# Patient Record
Sex: Male | Born: 1950
Health system: Southern US, Community
[De-identification: ages and names within clinical notes are randomized; demographics above are authoritative.]

## PROBLEM LIST (undated history)

## (undated) HISTORY — PX: ROTATOR CUFF REPAIR: SHX139

---

## 2001-03-11 ENCOUNTER — Ambulatory Visit (HOSPITAL_COMMUNITY): Admission: RE | Admit: 2001-03-11 | Discharge: 2001-03-11 | Payer: Self-pay | Admitting: Family Medicine

## 2001-03-11 ENCOUNTER — Encounter: Payer: Self-pay | Admitting: Family Medicine

## 2001-04-04 ENCOUNTER — Ambulatory Visit (HOSPITAL_COMMUNITY): Admission: RE | Admit: 2001-04-04 | Discharge: 2001-04-04 | Payer: Self-pay | Admitting: Family Medicine

## 2001-04-04 ENCOUNTER — Encounter: Payer: Self-pay | Admitting: Family Medicine

## 2004-07-07 ENCOUNTER — Ambulatory Visit (HOSPITAL_BASED_OUTPATIENT_CLINIC_OR_DEPARTMENT_OTHER): Admission: RE | Admit: 2004-07-07 | Discharge: 2004-07-07 | Payer: Self-pay | Admitting: Orthopedic Surgery

## 2004-07-07 ENCOUNTER — Ambulatory Visit (HOSPITAL_COMMUNITY): Admission: RE | Admit: 2004-07-07 | Discharge: 2004-07-07 | Payer: Self-pay | Admitting: Orthopedic Surgery

## 2010-11-14 NOTE — Op Note (Signed)
Jose Miles, Jose Miles NO.:  [id number]   MEDICAL RECORD NO.:  [id number]          PATIENT TYPE:  AMB   LOCATION:  DSC                          FACILITY:  [facility]   PHYSICIAN:  Robert A. Thurston Hole, M.D. DATE OF BIRTH:  October 06, 1950   DATE OF PROCEDURE:  07/07/2004  DATE OF DISCHARGE:                                 OPERATIVE REPORT   PREOPERATIVE DIAGNOSES:  1.  Right shoulder rotator cuff tear.  2.  Right shoulder partial labrum tear.  3.  Right shoulder impingement.   POSTOPERATIVE DIAGNOSES:  1.  Right shoulder rotator cuff tear.  2.  Right shoulder partial labrum tear.  3.  Right shoulder impingement.   PROCEDURES:  1.  Right shoulder examination under anesthesia followed by arthroscopically      assisted  rotator cuff repair using Arthrex suture anchor x1.  2.  Right shoulder arthroscopic partial labrum tear debridement.  3.  Right shoulder subacromial decompression.   SURGEON:  Elana Alm. Thurston Hole, M.D.   ASSISTANT:  Julien Girt, P.A.   ANESTHESIA:  General anesthesia.   OPERATIVE TIME:  One hour.   COMPLICATIONS:  None.   INDICATIONS FOR PROCEDURE:  Jose Miles is a 60 year old gentleman who has had  one to two years of increasing right shoulder pain with exam and MRI  documenting a partial versus complete rotator cuff tear with impingement and  partial labrum tear who has failed conservative care and is now to undergo  arthroscopy.   DESCRIPTION OF PROCEDURE:  Jose Miles is brought to the operating room on  July 07, 2004, after an interscalene block had been placed in the holding  room by anesthesia.  He is placed on the operating table in the supine  position.  His right shoulder was examined under anesthesia.  He had a full  range of motion in his shoulder with stable ligamentous examination.  He  received Ancef 1 g intraoperatively for prophylaxis.  He was then placed in  beach chair position and shoulder and arm were prepped using sterile  DuraPrep and draped using sterile technique.  Originally through a posterior  arthroscopic portal, the arthroscope with a pump attachment was placed and  through an anterior portal, arthroscopic probe was placed.  On initial  inspection, the articular cartilage in the glenohumeral joint was intact.  Anterior labrum partial tearing and superiorly 25 to 30% which was debrided.  Inferior labrum and anterior inferior glenohumeral ligament complex was  intact.  Biceps  tendon anchor and biceps tendon was intact. Posterior  labrum was intact.  Rotator cuff on the articular surface had a high grade  partial tear, 30 to 40% in the supraspinatus as well as the infraspinatus  which was debrided arthroscopically but he did not have a complete tear.  The rest of the rotator cuff was intact.  The inferior capsular recess was  free of pathology.  Subacromial space was entered and a lateral arthroscopic  portal was made.  Moderately thickened bursitis was resected.  Impingement  was noted and a subacromial decompression was carried out removing 6 to 8 mm  of the undersurface of the anterior, anterolateral  and anterior medial  acromion and CA ligament release was carried out as well.  The [facility] joint  showed no impingement into the subacromial space, thus it was not resected.  The rotator cuff on the bursal side showed a significant tear on the  supraspinatus although the deep fibers were still intact.  The superficial  fibers were torn from the greater tuberosity attachment and this was  amenable to repair.  The deep fibers were kept intact and an Arthrex suture  anchor was placed in the greater tuberosity and then each of the sutures in  this anchor was placed through the rotator cuff tear and tied down, thus  securing the rotator cuff tear back down to the greater tuberosity.  There  was a separate posterior partial tear of the infraspinatus and teres minor  that was layered.  This was partially debrided  arthroscopically but it did  not communicate with the joint, thus after debridement no further repair was  necessary of this posterior piece.  After this was done, the shoulder could  be brought through a full range of motion with no impingement on the repair.  At this point, it was felt that all the pathology had been satisfactory  addressed.  The instruments were removed.  Portals closed with 3-0 nylon  suture.  Sterile dressings and a sling applied and the patient awakened and  taken to the recovery room in stable condition.   FOLLOW UP:  Jose Miles will be followed as an outpatient on Percocet for pain  with early physical therapy.  See him back in the office in a week for  sutures out and follow-up.       RAW/MEDQ  D:  07/07/2004  T:  07/07/2004  Job:  [id number]

## 2011-06-09 ENCOUNTER — Ambulatory Visit: Payer: 59 | Attending: Sports Medicine | Admitting: Physical Therapy

## 2011-06-09 DIAGNOSIS — IMO0001 Reserved for inherently not codable concepts without codable children: Secondary | ICD-10-CM | POA: Insufficient documentation

## 2011-06-09 DIAGNOSIS — M2569 Stiffness of other specified joint, not elsewhere classified: Secondary | ICD-10-CM | POA: Insufficient documentation

## 2011-06-09 DIAGNOSIS — M25519 Pain in unspecified shoulder: Secondary | ICD-10-CM | POA: Insufficient documentation

## 2011-06-10 ENCOUNTER — Ambulatory Visit: Payer: 59 | Admitting: Physical Therapy

## 2011-06-17 ENCOUNTER — Ambulatory Visit: Payer: 59 | Admitting: Physical Therapy

## 2011-06-18 ENCOUNTER — Ambulatory Visit: Payer: 59 | Admitting: Physical Therapy

## 2011-06-25 ENCOUNTER — Ambulatory Visit: Payer: Self-pay | Admitting: Physical Therapy

## 2011-06-25 ENCOUNTER — Ambulatory Visit: Payer: 59 | Admitting: Physical Therapy

## 2011-07-01 ENCOUNTER — Ambulatory Visit: Payer: 59 | Attending: Sports Medicine | Admitting: Physical Therapy

## 2011-07-01 DIAGNOSIS — M25519 Pain in unspecified shoulder: Secondary | ICD-10-CM | POA: Insufficient documentation

## 2011-07-01 DIAGNOSIS — IMO0001 Reserved for inherently not codable concepts without codable children: Secondary | ICD-10-CM | POA: Insufficient documentation

## 2011-07-01 DIAGNOSIS — M2569 Stiffness of other specified joint, not elsewhere classified: Secondary | ICD-10-CM | POA: Insufficient documentation

## 2011-07-03 ENCOUNTER — Encounter: Payer: Self-pay | Admitting: Physical Therapy

## 2011-07-07 ENCOUNTER — Ambulatory Visit: Payer: 59 | Admitting: Physical Therapy

## 2011-07-09 ENCOUNTER — Encounter: Payer: Self-pay | Admitting: Physical Therapy

## 2011-09-15 ENCOUNTER — Other Ambulatory Visit (HOSPITAL_COMMUNITY): Payer: Self-pay | Admitting: Chiropractic Medicine

## 2011-09-15 ENCOUNTER — Ambulatory Visit (HOSPITAL_COMMUNITY)
Admission: RE | Admit: 2011-09-15 | Discharge: 2011-09-15 | Disposition: A | Discharge status: Home / Self Care | Payer: 59 | Source: Ambulatory Visit | Attending: Chiropractic Medicine | Admitting: Chiropractic Medicine

## 2011-09-15 DIAGNOSIS — I77810 Thoracic aortic ectasia: Secondary | ICD-10-CM | POA: Insufficient documentation

## 2011-09-15 DIAGNOSIS — M25519 Pain in unspecified shoulder: Secondary | ICD-10-CM | POA: Insufficient documentation

## 2011-09-15 DIAGNOSIS — R52 Pain, unspecified: Secondary | ICD-10-CM

## 2012-12-30 IMAGING — CR DG RIBS W/ CHEST 3+V*L*
5 series · 5 of 5 positions shown · non-contrast
Comparison: None.

CLINICAL DATA: 6-month history of pain in the posterior aspect of
the left shoulder around the scapula..  No known injury or fall.

LEFT RIBS AND CHEST - 3+ VIEW

[w chest pa]
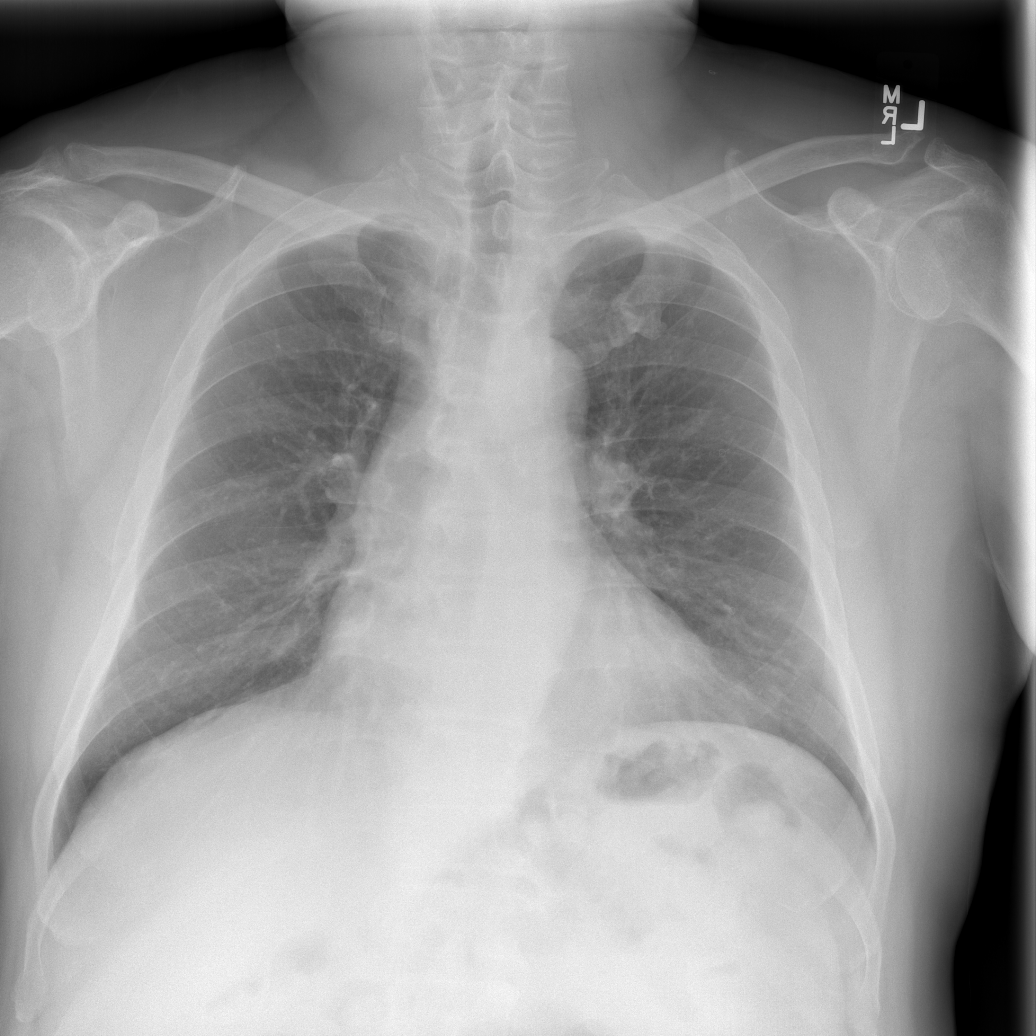

[w ribs ap/pa upper left *]
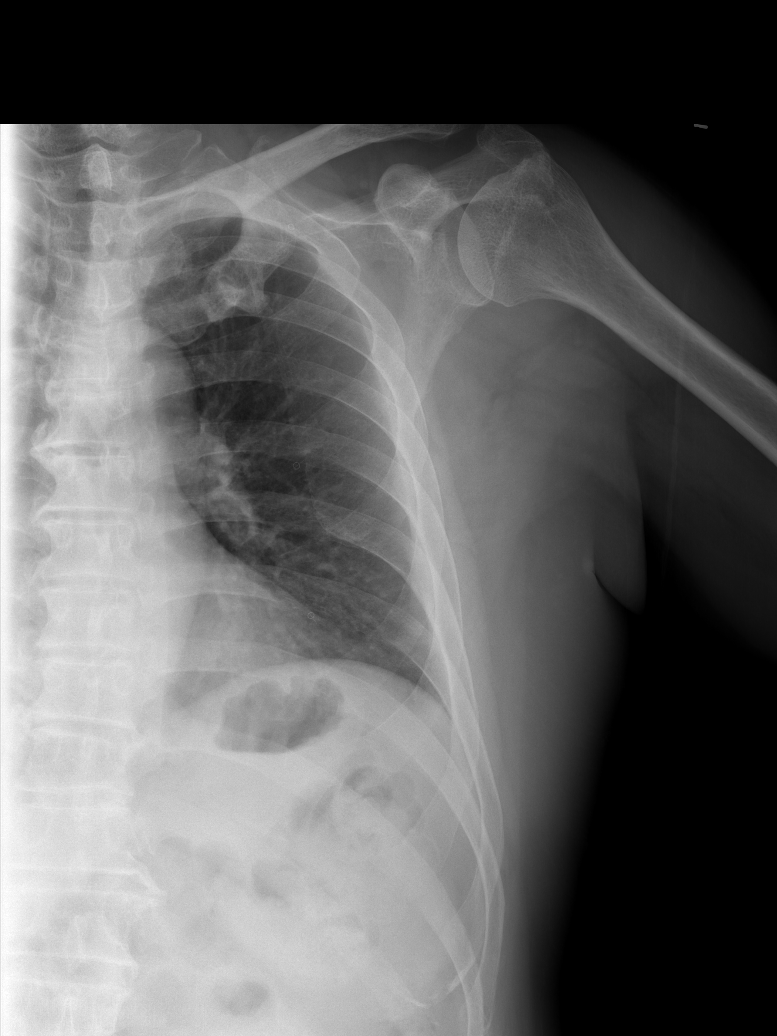

[w ribs ap/pa lower left *]
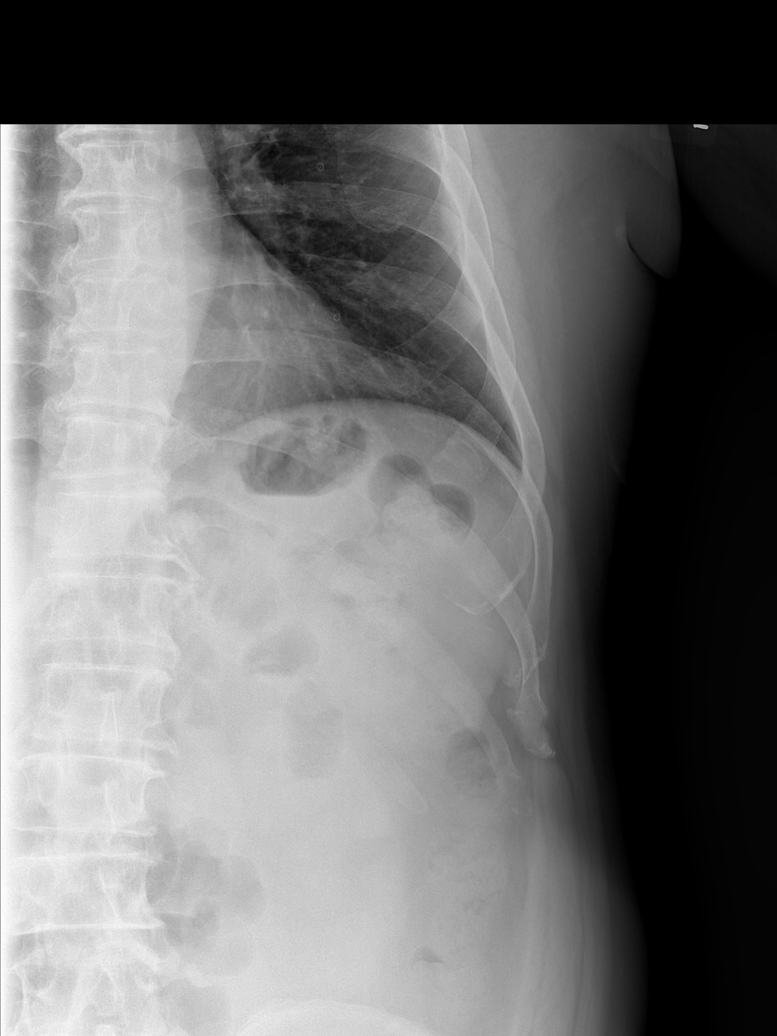

[w ribs oblique left * (1 of 2)]
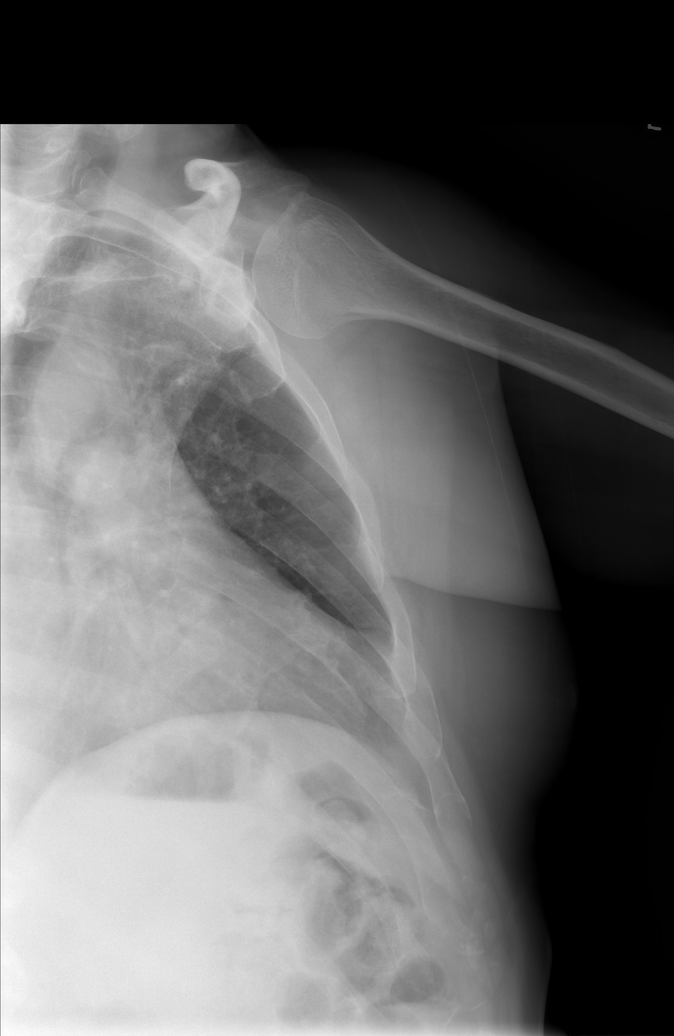

[w ribs oblique left * (2 of 2)]
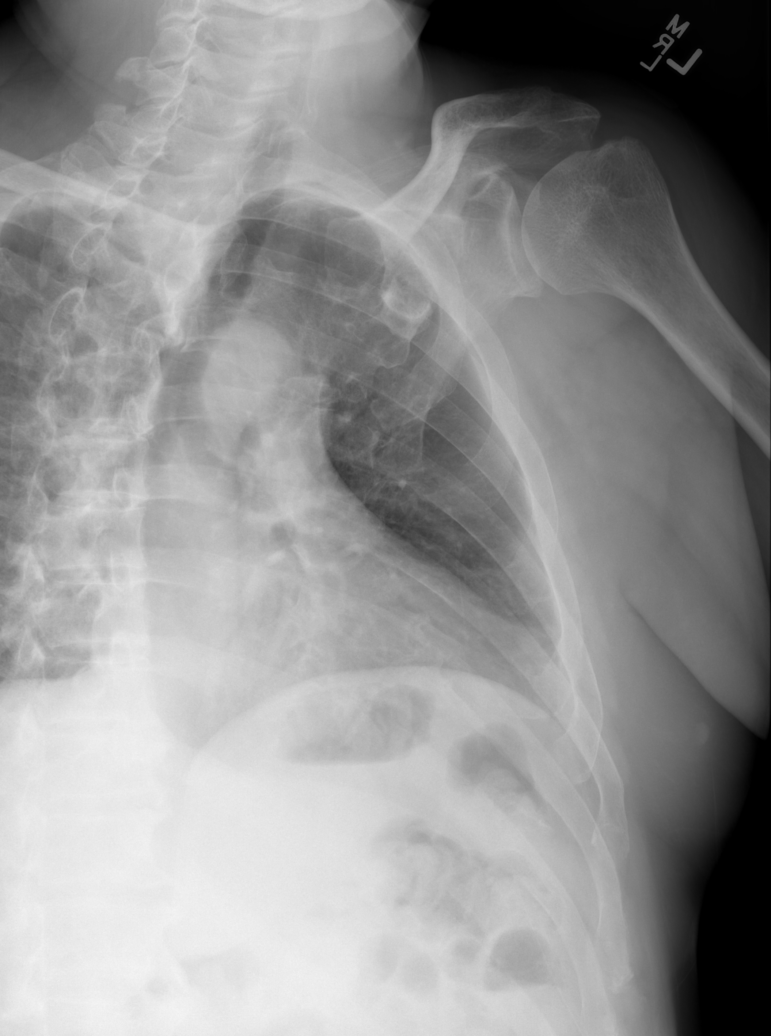

[5 of 5 positions shown; findings below may reference images not displayed]

FINDINGS: Cardiac silhouette is borderline in size.  Ectasia and
tortuosity of thoracic aorta are seen.  No pulmonary edema,
pneumonia, or pleural effusion is evident.  Apical pleural or
extrapleural fat densities are symmetrical.  No rib lesion is
evident.
IMPRESSION: Borderline heart size.  No acute cardiopulmonary or pleural
abnormalities are seen.  No pneumothorax or rib lesion is evident.

## 2015-11-08 ENCOUNTER — Ambulatory Visit (INDEPENDENT_AMBULATORY_CARE_PROVIDER_SITE_OTHER): Payer: No Typology Code available for payment source

## 2015-11-08 ENCOUNTER — Ambulatory Visit (INDEPENDENT_AMBULATORY_CARE_PROVIDER_SITE_OTHER): Payer: No Typology Code available for payment source | Admitting: Podiatry

## 2015-11-08 ENCOUNTER — Encounter: Payer: Self-pay | Admitting: Podiatry

## 2015-11-08 VITALS — BP 125/72 | HR 80 | Resp 16 | Ht 65.0 in | Wt 214.0 lb

## 2015-11-08 DIAGNOSIS — M779 Enthesopathy, unspecified: Secondary | ICD-10-CM

## 2015-11-08 DIAGNOSIS — M79671 Pain in right foot: Secondary | ICD-10-CM | POA: Diagnosis not present

## 2015-11-08 DIAGNOSIS — L84 Corns and callosities: Secondary | ICD-10-CM

## 2015-11-08 DIAGNOSIS — M2041 Other hammer toe(s) (acquired), right foot: Secondary | ICD-10-CM

## 2015-11-08 NOTE — Progress Notes (Signed)
   Subjective:    Patient ID: Jose FlatteryYoshio Monds, male    DOB: 08/29/1950, 65 y.o.   MRN: [id number]  HPI Chief Complaint  Patient presents with  . Foot Orthotics    Wants to get new ones made; pt diabetic type 2; sugar=did not take today; A1C=7.4  . Foot Pain    Right foot; 5th toe; pt stated, "Toe is pushing into shoe"; x1 year (corn?)      Review of Systems  All other systems reviewed and are negative.      Objective:   Physical Exam        Assessment & Plan:

## 2015-11-08 NOTE — Progress Notes (Signed)
Subjective:     Patient ID: Jose Miles, male   DOB: 09/29/1950, 65 y.o.   MRN: [id number]  HPI patient states I developed a lot of pain in the side of my right fifth toe that's been going on for a while. States that also pain in the feet is generalized and orthotics and been helpful but they have worn out and the toe seems to become irritated every year or 2   Review of Systems  All other systems reviewed and are negative.      Objective:   Physical Exam  Constitutional: He is oriented to person, place, and time.  Cardiovascular: Intact distal pulses.   Musculoskeletal: Normal range of motion.  Neurological: He is oriented to person, place, and time.  Skin: Skin is warm.  Nursing note and vitals reviewed.  neurovascular status found to be intact with muscle strength adequate range of motion within normal limits. Patient's found to have inflammation and distal lateral keratotic lesion digit 5 right that's painful when pressed with rotation and moderate discomfort in the plantar feet bilateral with good digital perfusion and patient found to be well oriented 3     Assessment:     Hammertoe deformity fifth digit right with rotation and distal keratotic tissue formation with tendinitis    Plan:     H&P and x-ray reviewed with patient. Today I debrided the lesion on the right fifth toe and applied padding and discussed possible arthroplasty which may be necessary and I scanned for orthotics to reduce pressure against the plantar foot and feet bilateral  X-ray report indicates significant rotation digit 5 right at the distal interphalangeal joint

## 2015-12-04 ENCOUNTER — Ambulatory Visit: Payer: Medicare Other | Admitting: *Deleted

## 2015-12-04 DIAGNOSIS — M79671 Pain in right foot: Secondary | ICD-10-CM

## 2015-12-04 NOTE — Progress Notes (Signed)
Patient ID: Jose FlatteryYoshio Trager, male   DOB: 08/27/1950, 65 y.o.   MRN: [id number] Patient presents for orthotic pick up.  Orthotics were found to be missing 3/4" lift on the right that the patient did not mention he needed.  Orthotics will be sent back for the additional accomodation and we will call patient when they return.

## 2015-12-04 NOTE — Patient Instructions (Signed)

## 2016-07-28 DIAGNOSIS — D696 Thrombocytopenia, unspecified: Secondary | ICD-10-CM | POA: Diagnosis not present

## 2016-07-28 DIAGNOSIS — E113513 Type 2 diabetes mellitus with proliferative diabetic retinopathy with macular edema, bilateral: Secondary | ICD-10-CM | POA: Diagnosis not present

## 2016-07-28 DIAGNOSIS — E782 Mixed hyperlipidemia: Secondary | ICD-10-CM | POA: Diagnosis not present

## 2016-07-29 DIAGNOSIS — I10 Essential (primary) hypertension: Secondary | ICD-10-CM | POA: Diagnosis not present

## 2016-07-29 DIAGNOSIS — E782 Mixed hyperlipidemia: Secondary | ICD-10-CM | POA: Diagnosis not present

## 2016-07-29 DIAGNOSIS — E559 Vitamin D deficiency, unspecified: Secondary | ICD-10-CM | POA: Diagnosis not present

## 2016-07-29 DIAGNOSIS — E113213 Type 2 diabetes mellitus with mild nonproliferative diabetic retinopathy with macular edema, bilateral: Secondary | ICD-10-CM | POA: Diagnosis not present

## 2016-07-29 DIAGNOSIS — D691 Qualitative platelet defects: Secondary | ICD-10-CM | POA: Diagnosis not present

## 2016-07-29 DIAGNOSIS — E1165 Type 2 diabetes mellitus with hyperglycemia: Secondary | ICD-10-CM | POA: Diagnosis not present

## 2016-07-29 DIAGNOSIS — Z794 Long term (current) use of insulin: Secondary | ICD-10-CM | POA: Diagnosis not present

## 2016-10-21 DIAGNOSIS — E782 Mixed hyperlipidemia: Secondary | ICD-10-CM | POA: Diagnosis not present

## 2016-10-21 DIAGNOSIS — E1165 Type 2 diabetes mellitus with hyperglycemia: Secondary | ICD-10-CM | POA: Diagnosis not present

## 2016-10-21 DIAGNOSIS — Z125 Encounter for screening for malignant neoplasm of prostate: Secondary | ICD-10-CM | POA: Diagnosis not present

## 2016-10-21 DIAGNOSIS — D691 Qualitative platelet defects: Secondary | ICD-10-CM | POA: Diagnosis not present

## 2016-10-21 DIAGNOSIS — Z794 Long term (current) use of insulin: Secondary | ICD-10-CM | POA: Diagnosis not present

## 2016-10-26 DIAGNOSIS — I10 Essential (primary) hypertension: Secondary | ICD-10-CM | POA: Diagnosis not present

## 2016-10-26 DIAGNOSIS — Z794 Long term (current) use of insulin: Secondary | ICD-10-CM | POA: Diagnosis not present

## 2016-10-26 DIAGNOSIS — E113213 Type 2 diabetes mellitus with mild nonproliferative diabetic retinopathy with macular edema, bilateral: Secondary | ICD-10-CM | POA: Diagnosis not present

## 2016-10-26 DIAGNOSIS — E559 Vitamin D deficiency, unspecified: Secondary | ICD-10-CM | POA: Diagnosis not present

## 2016-10-26 DIAGNOSIS — E782 Mixed hyperlipidemia: Secondary | ICD-10-CM | POA: Diagnosis not present

## 2016-10-26 DIAGNOSIS — Z125 Encounter for screening for malignant neoplasm of prostate: Secondary | ICD-10-CM | POA: Diagnosis not present

## 2016-10-26 DIAGNOSIS — D696 Thrombocytopenia, unspecified: Secondary | ICD-10-CM | POA: Diagnosis not present

## 2016-11-19 DIAGNOSIS — H2513 Age-related nuclear cataract, bilateral: Secondary | ICD-10-CM | POA: Diagnosis not present

## 2016-11-19 DIAGNOSIS — H43392 Other vitreous opacities, left eye: Secondary | ICD-10-CM | POA: Diagnosis not present

## 2016-11-19 DIAGNOSIS — E113293 Type 2 diabetes mellitus with mild nonproliferative diabetic retinopathy without macular edema, bilateral: Secondary | ICD-10-CM | POA: Diagnosis not present

## 2016-11-26 DIAGNOSIS — M7582 Other shoulder lesions, left shoulder: Secondary | ICD-10-CM | POA: Diagnosis not present

## 2016-11-26 DIAGNOSIS — M722 Plantar fascial fibromatosis: Secondary | ICD-10-CM | POA: Diagnosis not present

## 2017-01-26 DIAGNOSIS — D696 Thrombocytopenia, unspecified: Secondary | ICD-10-CM | POA: Diagnosis not present

## 2017-01-26 DIAGNOSIS — E1165 Type 2 diabetes mellitus with hyperglycemia: Secondary | ICD-10-CM | POA: Diagnosis not present

## 2017-01-26 DIAGNOSIS — E782 Mixed hyperlipidemia: Secondary | ICD-10-CM | POA: Diagnosis not present

## 2017-01-26 DIAGNOSIS — Z794 Long term (current) use of insulin: Secondary | ICD-10-CM | POA: Diagnosis not present

## 2017-02-08 DIAGNOSIS — E1165 Type 2 diabetes mellitus with hyperglycemia: Secondary | ICD-10-CM | POA: Diagnosis not present

## 2017-02-08 DIAGNOSIS — I10 Essential (primary) hypertension: Secondary | ICD-10-CM | POA: Diagnosis not present

## 2017-02-08 DIAGNOSIS — E113213 Type 2 diabetes mellitus with mild nonproliferative diabetic retinopathy with macular edema, bilateral: Secondary | ICD-10-CM | POA: Diagnosis not present

## 2017-02-08 DIAGNOSIS — E782 Mixed hyperlipidemia: Secondary | ICD-10-CM | POA: Diagnosis not present

## 2017-05-11 DIAGNOSIS — D696 Thrombocytopenia, unspecified: Secondary | ICD-10-CM | POA: Diagnosis not present

## 2017-05-11 DIAGNOSIS — Z794 Long term (current) use of insulin: Secondary | ICD-10-CM | POA: Diagnosis not present

## 2017-05-11 DIAGNOSIS — E1165 Type 2 diabetes mellitus with hyperglycemia: Secondary | ICD-10-CM | POA: Diagnosis not present

## 2017-05-11 DIAGNOSIS — E782 Mixed hyperlipidemia: Secondary | ICD-10-CM | POA: Diagnosis not present

## 2017-05-13 DIAGNOSIS — E1165 Type 2 diabetes mellitus with hyperglycemia: Secondary | ICD-10-CM | POA: Diagnosis not present

## 2017-05-13 DIAGNOSIS — E782 Mixed hyperlipidemia: Secondary | ICD-10-CM | POA: Diagnosis not present

## 2017-05-13 DIAGNOSIS — I10 Essential (primary) hypertension: Secondary | ICD-10-CM | POA: Diagnosis not present

## 2017-05-13 DIAGNOSIS — Z23 Encounter for immunization: Secondary | ICD-10-CM | POA: Diagnosis not present

## 2017-05-13 DIAGNOSIS — E113213 Type 2 diabetes mellitus with mild nonproliferative diabetic retinopathy with macular edema, bilateral: Secondary | ICD-10-CM | POA: Diagnosis not present

## 2017-08-02 DIAGNOSIS — Z794 Long term (current) use of insulin: Secondary | ICD-10-CM | POA: Diagnosis not present

## 2017-08-02 DIAGNOSIS — D696 Thrombocytopenia, unspecified: Secondary | ICD-10-CM | POA: Diagnosis not present

## 2017-08-02 DIAGNOSIS — E782 Mixed hyperlipidemia: Secondary | ICD-10-CM | POA: Diagnosis not present

## 2017-08-02 DIAGNOSIS — E1165 Type 2 diabetes mellitus with hyperglycemia: Secondary | ICD-10-CM | POA: Diagnosis not present

## 2017-08-09 DIAGNOSIS — I10 Essential (primary) hypertension: Secondary | ICD-10-CM | POA: Diagnosis not present

## 2017-08-09 DIAGNOSIS — E113213 Type 2 diabetes mellitus with mild nonproliferative diabetic retinopathy with macular edema, bilateral: Secondary | ICD-10-CM | POA: Diagnosis not present

## 2017-08-09 DIAGNOSIS — E782 Mixed hyperlipidemia: Secondary | ICD-10-CM | POA: Diagnosis not present

## 2017-08-09 DIAGNOSIS — E1165 Type 2 diabetes mellitus with hyperglycemia: Secondary | ICD-10-CM | POA: Diagnosis not present

## 2017-11-09 DIAGNOSIS — E782 Mixed hyperlipidemia: Secondary | ICD-10-CM | POA: Diagnosis not present

## 2017-11-09 DIAGNOSIS — Z125 Encounter for screening for malignant neoplasm of prostate: Secondary | ICD-10-CM | POA: Diagnosis not present

## 2017-11-09 DIAGNOSIS — E559 Vitamin D deficiency, unspecified: Secondary | ICD-10-CM | POA: Diagnosis not present

## 2017-11-09 DIAGNOSIS — Z794 Long term (current) use of insulin: Secondary | ICD-10-CM | POA: Diagnosis not present

## 2017-11-09 DIAGNOSIS — E1165 Type 2 diabetes mellitus with hyperglycemia: Secondary | ICD-10-CM | POA: Diagnosis not present

## 2017-11-11 DIAGNOSIS — E782 Mixed hyperlipidemia: Secondary | ICD-10-CM | POA: Diagnosis not present

## 2017-11-11 DIAGNOSIS — E113213 Type 2 diabetes mellitus with mild nonproliferative diabetic retinopathy with macular edema, bilateral: Secondary | ICD-10-CM | POA: Diagnosis not present

## 2017-11-11 DIAGNOSIS — E1165 Type 2 diabetes mellitus with hyperglycemia: Secondary | ICD-10-CM | POA: Diagnosis not present

## 2017-11-11 DIAGNOSIS — I10 Essential (primary) hypertension: Secondary | ICD-10-CM | POA: Diagnosis not present

## 2017-12-02 DIAGNOSIS — M5116 Intervertebral disc disorders with radiculopathy, lumbar region: Secondary | ICD-10-CM | POA: Diagnosis not present

## 2017-12-02 DIAGNOSIS — M9903 Segmental and somatic dysfunction of lumbar region: Secondary | ICD-10-CM | POA: Diagnosis not present

## 2017-12-02 DIAGNOSIS — Q72812 Congenital shortening of left lower limb: Secondary | ICD-10-CM | POA: Diagnosis not present

## 2017-12-02 DIAGNOSIS — M9905 Segmental and somatic dysfunction of pelvic region: Secondary | ICD-10-CM | POA: Diagnosis not present

## 2017-12-07 DIAGNOSIS — M5116 Intervertebral disc disorders with radiculopathy, lumbar region: Secondary | ICD-10-CM | POA: Diagnosis not present

## 2017-12-07 DIAGNOSIS — Q72812 Congenital shortening of left lower limb: Secondary | ICD-10-CM | POA: Diagnosis not present

## 2017-12-07 DIAGNOSIS — M9903 Segmental and somatic dysfunction of lumbar region: Secondary | ICD-10-CM | POA: Diagnosis not present

## 2017-12-07 DIAGNOSIS — M9905 Segmental and somatic dysfunction of pelvic region: Secondary | ICD-10-CM | POA: Diagnosis not present

## 2017-12-08 DIAGNOSIS — M9903 Segmental and somatic dysfunction of lumbar region: Secondary | ICD-10-CM | POA: Diagnosis not present

## 2017-12-08 DIAGNOSIS — Q72812 Congenital shortening of left lower limb: Secondary | ICD-10-CM | POA: Diagnosis not present

## 2017-12-08 DIAGNOSIS — M9905 Segmental and somatic dysfunction of pelvic region: Secondary | ICD-10-CM | POA: Diagnosis not present

## 2017-12-08 DIAGNOSIS — M5116 Intervertebral disc disorders with radiculopathy, lumbar region: Secondary | ICD-10-CM | POA: Diagnosis not present

## 2017-12-09 DIAGNOSIS — Q72812 Congenital shortening of left lower limb: Secondary | ICD-10-CM | POA: Diagnosis not present

## 2017-12-09 DIAGNOSIS — M9903 Segmental and somatic dysfunction of lumbar region: Secondary | ICD-10-CM | POA: Diagnosis not present

## 2017-12-09 DIAGNOSIS — M9905 Segmental and somatic dysfunction of pelvic region: Secondary | ICD-10-CM | POA: Diagnosis not present

## 2017-12-09 DIAGNOSIS — M5116 Intervertebral disc disorders with radiculopathy, lumbar region: Secondary | ICD-10-CM | POA: Diagnosis not present

## 2017-12-16 DIAGNOSIS — M9905 Segmental and somatic dysfunction of pelvic region: Secondary | ICD-10-CM | POA: Diagnosis not present

## 2017-12-16 DIAGNOSIS — M5116 Intervertebral disc disorders with radiculopathy, lumbar region: Secondary | ICD-10-CM | POA: Diagnosis not present

## 2017-12-16 DIAGNOSIS — M9903 Segmental and somatic dysfunction of lumbar region: Secondary | ICD-10-CM | POA: Diagnosis not present

## 2017-12-16 DIAGNOSIS — Q72812 Congenital shortening of left lower limb: Secondary | ICD-10-CM | POA: Diagnosis not present

## 2017-12-20 DIAGNOSIS — Q72812 Congenital shortening of left lower limb: Secondary | ICD-10-CM | POA: Diagnosis not present

## 2017-12-20 DIAGNOSIS — M5116 Intervertebral disc disorders with radiculopathy, lumbar region: Secondary | ICD-10-CM | POA: Diagnosis not present

## 2017-12-20 DIAGNOSIS — M9903 Segmental and somatic dysfunction of lumbar region: Secondary | ICD-10-CM | POA: Diagnosis not present

## 2017-12-20 DIAGNOSIS — M9905 Segmental and somatic dysfunction of pelvic region: Secondary | ICD-10-CM | POA: Diagnosis not present

## 2017-12-21 DIAGNOSIS — M5116 Intervertebral disc disorders with radiculopathy, lumbar region: Secondary | ICD-10-CM | POA: Diagnosis not present

## 2017-12-21 DIAGNOSIS — Q72812 Congenital shortening of left lower limb: Secondary | ICD-10-CM | POA: Diagnosis not present

## 2017-12-21 DIAGNOSIS — M9905 Segmental and somatic dysfunction of pelvic region: Secondary | ICD-10-CM | POA: Diagnosis not present

## 2017-12-21 DIAGNOSIS — M9903 Segmental and somatic dysfunction of lumbar region: Secondary | ICD-10-CM | POA: Diagnosis not present

## 2017-12-23 DIAGNOSIS — Q72812 Congenital shortening of left lower limb: Secondary | ICD-10-CM | POA: Diagnosis not present

## 2017-12-23 DIAGNOSIS — M9903 Segmental and somatic dysfunction of lumbar region: Secondary | ICD-10-CM | POA: Diagnosis not present

## 2017-12-23 DIAGNOSIS — M5116 Intervertebral disc disorders with radiculopathy, lumbar region: Secondary | ICD-10-CM | POA: Diagnosis not present

## 2017-12-23 DIAGNOSIS — M9905 Segmental and somatic dysfunction of pelvic region: Secondary | ICD-10-CM | POA: Diagnosis not present

## 2018-01-06 DIAGNOSIS — M5116 Intervertebral disc disorders with radiculopathy, lumbar region: Secondary | ICD-10-CM | POA: Diagnosis not present

## 2018-01-06 DIAGNOSIS — Q72812 Congenital shortening of left lower limb: Secondary | ICD-10-CM | POA: Diagnosis not present

## 2018-01-06 DIAGNOSIS — M9903 Segmental and somatic dysfunction of lumbar region: Secondary | ICD-10-CM | POA: Diagnosis not present

## 2018-01-06 DIAGNOSIS — M9905 Segmental and somatic dysfunction of pelvic region: Secondary | ICD-10-CM | POA: Diagnosis not present

## 2018-01-10 DIAGNOSIS — M9903 Segmental and somatic dysfunction of lumbar region: Secondary | ICD-10-CM | POA: Diagnosis not present

## 2018-01-10 DIAGNOSIS — M9905 Segmental and somatic dysfunction of pelvic region: Secondary | ICD-10-CM | POA: Diagnosis not present

## 2018-01-10 DIAGNOSIS — M5116 Intervertebral disc disorders with radiculopathy, lumbar region: Secondary | ICD-10-CM | POA: Diagnosis not present

## 2018-01-10 DIAGNOSIS — Q72812 Congenital shortening of left lower limb: Secondary | ICD-10-CM | POA: Diagnosis not present

## 2018-01-13 DIAGNOSIS — M9905 Segmental and somatic dysfunction of pelvic region: Secondary | ICD-10-CM | POA: Diagnosis not present

## 2018-01-13 DIAGNOSIS — M9903 Segmental and somatic dysfunction of lumbar region: Secondary | ICD-10-CM | POA: Diagnosis not present

## 2018-01-13 DIAGNOSIS — M5116 Intervertebral disc disorders with radiculopathy, lumbar region: Secondary | ICD-10-CM | POA: Diagnosis not present

## 2018-01-13 DIAGNOSIS — Q72812 Congenital shortening of left lower limb: Secondary | ICD-10-CM | POA: Diagnosis not present

## 2018-01-17 DIAGNOSIS — M9905 Segmental and somatic dysfunction of pelvic region: Secondary | ICD-10-CM | POA: Diagnosis not present

## 2018-01-17 DIAGNOSIS — M9903 Segmental and somatic dysfunction of lumbar region: Secondary | ICD-10-CM | POA: Diagnosis not present

## 2018-01-17 DIAGNOSIS — M5116 Intervertebral disc disorders with radiculopathy, lumbar region: Secondary | ICD-10-CM | POA: Diagnosis not present

## 2018-01-17 DIAGNOSIS — Q72812 Congenital shortening of left lower limb: Secondary | ICD-10-CM | POA: Diagnosis not present

## 2018-01-20 DIAGNOSIS — M9905 Segmental and somatic dysfunction of pelvic region: Secondary | ICD-10-CM | POA: Diagnosis not present

## 2018-01-20 DIAGNOSIS — M9903 Segmental and somatic dysfunction of lumbar region: Secondary | ICD-10-CM | POA: Diagnosis not present

## 2018-01-20 DIAGNOSIS — M5116 Intervertebral disc disorders with radiculopathy, lumbar region: Secondary | ICD-10-CM | POA: Diagnosis not present

## 2018-01-20 DIAGNOSIS — Q72812 Congenital shortening of left lower limb: Secondary | ICD-10-CM | POA: Diagnosis not present

## 2018-01-25 DIAGNOSIS — M9903 Segmental and somatic dysfunction of lumbar region: Secondary | ICD-10-CM | POA: Diagnosis not present

## 2018-01-25 DIAGNOSIS — M9905 Segmental and somatic dysfunction of pelvic region: Secondary | ICD-10-CM | POA: Diagnosis not present

## 2018-01-25 DIAGNOSIS — M5116 Intervertebral disc disorders with radiculopathy, lumbar region: Secondary | ICD-10-CM | POA: Diagnosis not present

## 2018-01-25 DIAGNOSIS — Q72812 Congenital shortening of left lower limb: Secondary | ICD-10-CM | POA: Diagnosis not present

## 2018-02-01 DIAGNOSIS — M9905 Segmental and somatic dysfunction of pelvic region: Secondary | ICD-10-CM | POA: Diagnosis not present

## 2018-02-01 DIAGNOSIS — M5116 Intervertebral disc disorders with radiculopathy, lumbar region: Secondary | ICD-10-CM | POA: Diagnosis not present

## 2018-02-01 DIAGNOSIS — Q72812 Congenital shortening of left lower limb: Secondary | ICD-10-CM | POA: Diagnosis not present

## 2018-02-01 DIAGNOSIS — M9903 Segmental and somatic dysfunction of lumbar region: Secondary | ICD-10-CM | POA: Diagnosis not present

## 2018-02-24 DIAGNOSIS — H3581 Retinal edema: Secondary | ICD-10-CM | POA: Diagnosis not present

## 2018-02-24 DIAGNOSIS — H2513 Age-related nuclear cataract, bilateral: Secondary | ICD-10-CM | POA: Diagnosis not present

## 2018-02-24 DIAGNOSIS — E113293 Type 2 diabetes mellitus with mild nonproliferative diabetic retinopathy without macular edema, bilateral: Secondary | ICD-10-CM | POA: Diagnosis not present

## 2018-02-24 DIAGNOSIS — H43392 Other vitreous opacities, left eye: Secondary | ICD-10-CM | POA: Diagnosis not present

## 2018-03-15 DIAGNOSIS — D696 Thrombocytopenia, unspecified: Secondary | ICD-10-CM | POA: Diagnosis not present

## 2018-03-15 DIAGNOSIS — E782 Mixed hyperlipidemia: Secondary | ICD-10-CM | POA: Diagnosis not present

## 2018-03-15 DIAGNOSIS — Z794 Long term (current) use of insulin: Secondary | ICD-10-CM | POA: Diagnosis not present

## 2018-03-15 DIAGNOSIS — E1165 Type 2 diabetes mellitus with hyperglycemia: Secondary | ICD-10-CM | POA: Diagnosis not present

## 2018-03-17 DIAGNOSIS — Z794 Long term (current) use of insulin: Secondary | ICD-10-CM | POA: Diagnosis not present

## 2018-03-17 DIAGNOSIS — Z125 Encounter for screening for malignant neoplasm of prostate: Secondary | ICD-10-CM | POA: Diagnosis not present

## 2018-03-17 DIAGNOSIS — E559 Vitamin D deficiency, unspecified: Secondary | ICD-10-CM | POA: Diagnosis not present

## 2018-03-17 DIAGNOSIS — E113213 Type 2 diabetes mellitus with mild nonproliferative diabetic retinopathy with macular edema, bilateral: Secondary | ICD-10-CM | POA: Diagnosis not present

## 2018-03-17 DIAGNOSIS — I10 Essential (primary) hypertension: Secondary | ICD-10-CM | POA: Diagnosis not present

## 2018-03-17 DIAGNOSIS — E782 Mixed hyperlipidemia: Secondary | ICD-10-CM | POA: Diagnosis not present

## 2018-03-17 DIAGNOSIS — D696 Thrombocytopenia, unspecified: Secondary | ICD-10-CM | POA: Diagnosis not present

## 2018-03-17 DIAGNOSIS — E1165 Type 2 diabetes mellitus with hyperglycemia: Secondary | ICD-10-CM | POA: Diagnosis not present

## 2018-03-17 DIAGNOSIS — Z23 Encounter for immunization: Secondary | ICD-10-CM | POA: Diagnosis not present

## 2018-06-13 DIAGNOSIS — E1165 Type 2 diabetes mellitus with hyperglycemia: Secondary | ICD-10-CM | POA: Diagnosis not present

## 2018-06-13 DIAGNOSIS — E782 Mixed hyperlipidemia: Secondary | ICD-10-CM | POA: Diagnosis not present

## 2018-06-13 DIAGNOSIS — Z794 Long term (current) use of insulin: Secondary | ICD-10-CM | POA: Diagnosis not present

## 2018-06-16 DIAGNOSIS — Z794 Long term (current) use of insulin: Secondary | ICD-10-CM | POA: Diagnosis not present

## 2018-06-16 DIAGNOSIS — I10 Essential (primary) hypertension: Secondary | ICD-10-CM | POA: Diagnosis not present

## 2018-06-16 DIAGNOSIS — E1165 Type 2 diabetes mellitus with hyperglycemia: Secondary | ICD-10-CM | POA: Diagnosis not present

## 2018-06-16 DIAGNOSIS — E559 Vitamin D deficiency, unspecified: Secondary | ICD-10-CM | POA: Diagnosis not present

## 2018-06-16 DIAGNOSIS — E782 Mixed hyperlipidemia: Secondary | ICD-10-CM | POA: Diagnosis not present

## 2018-06-16 DIAGNOSIS — E113213 Type 2 diabetes mellitus with mild nonproliferative diabetic retinopathy with macular edema, bilateral: Secondary | ICD-10-CM | POA: Diagnosis not present

## 2018-06-16 DIAGNOSIS — Z125 Encounter for screening for malignant neoplasm of prostate: Secondary | ICD-10-CM | POA: Diagnosis not present

## 2018-06-16 DIAGNOSIS — D696 Thrombocytopenia, unspecified: Secondary | ICD-10-CM | POA: Diagnosis not present

## 2018-09-06 DIAGNOSIS — E1165 Type 2 diabetes mellitus with hyperglycemia: Secondary | ICD-10-CM | POA: Diagnosis not present

## 2018-09-06 DIAGNOSIS — E782 Mixed hyperlipidemia: Secondary | ICD-10-CM | POA: Diagnosis not present

## 2018-09-06 DIAGNOSIS — Z794 Long term (current) use of insulin: Secondary | ICD-10-CM | POA: Diagnosis not present

## 2018-09-13 DIAGNOSIS — D696 Thrombocytopenia, unspecified: Secondary | ICD-10-CM | POA: Diagnosis not present

## 2018-09-13 DIAGNOSIS — E782 Mixed hyperlipidemia: Secondary | ICD-10-CM | POA: Diagnosis not present

## 2018-09-13 DIAGNOSIS — E1165 Type 2 diabetes mellitus with hyperglycemia: Secondary | ICD-10-CM | POA: Diagnosis not present

## 2018-09-13 DIAGNOSIS — Z794 Long term (current) use of insulin: Secondary | ICD-10-CM | POA: Diagnosis not present

## 2018-09-13 DIAGNOSIS — E559 Vitamin D deficiency, unspecified: Secondary | ICD-10-CM | POA: Diagnosis not present

## 2018-09-13 DIAGNOSIS — I10 Essential (primary) hypertension: Secondary | ICD-10-CM | POA: Diagnosis not present

## 2018-09-13 DIAGNOSIS — E113213 Type 2 diabetes mellitus with mild nonproliferative diabetic retinopathy with macular edema, bilateral: Secondary | ICD-10-CM | POA: Diagnosis not present

## 2018-09-13 DIAGNOSIS — Z125 Encounter for screening for malignant neoplasm of prostate: Secondary | ICD-10-CM | POA: Diagnosis not present

## 2018-12-07 DIAGNOSIS — E782 Mixed hyperlipidemia: Secondary | ICD-10-CM | POA: Diagnosis not present

## 2018-12-07 DIAGNOSIS — Z125 Encounter for screening for malignant neoplasm of prostate: Secondary | ICD-10-CM | POA: Diagnosis not present

## 2018-12-07 DIAGNOSIS — Z794 Long term (current) use of insulin: Secondary | ICD-10-CM | POA: Diagnosis not present

## 2018-12-07 DIAGNOSIS — E559 Vitamin D deficiency, unspecified: Secondary | ICD-10-CM | POA: Diagnosis not present

## 2018-12-07 DIAGNOSIS — E1165 Type 2 diabetes mellitus with hyperglycemia: Secondary | ICD-10-CM | POA: Diagnosis not present

## 2018-12-13 DIAGNOSIS — I10 Essential (primary) hypertension: Secondary | ICD-10-CM | POA: Diagnosis not present

## 2018-12-13 DIAGNOSIS — E559 Vitamin D deficiency, unspecified: Secondary | ICD-10-CM | POA: Diagnosis not present

## 2018-12-13 DIAGNOSIS — E1165 Type 2 diabetes mellitus with hyperglycemia: Secondary | ICD-10-CM | POA: Diagnosis not present

## 2018-12-13 DIAGNOSIS — Z125 Encounter for screening for malignant neoplasm of prostate: Secondary | ICD-10-CM | POA: Diagnosis not present

## 2018-12-13 DIAGNOSIS — E782 Mixed hyperlipidemia: Secondary | ICD-10-CM | POA: Diagnosis not present

## 2018-12-13 DIAGNOSIS — Z794 Long term (current) use of insulin: Secondary | ICD-10-CM | POA: Diagnosis not present

## 2018-12-13 DIAGNOSIS — E113213 Type 2 diabetes mellitus with mild nonproliferative diabetic retinopathy with macular edema, bilateral: Secondary | ICD-10-CM | POA: Diagnosis not present

## 2018-12-13 DIAGNOSIS — D696 Thrombocytopenia, unspecified: Secondary | ICD-10-CM | POA: Diagnosis not present

## 2018-12-14 DIAGNOSIS — Z1159 Encounter for screening for other viral diseases: Secondary | ICD-10-CM | POA: Diagnosis not present

## 2019-01-16 DIAGNOSIS — L03012 Cellulitis of left finger: Secondary | ICD-10-CM | POA: Diagnosis not present

## 2019-03-02 DIAGNOSIS — E113293 Type 2 diabetes mellitus with mild nonproliferative diabetic retinopathy without macular edema, bilateral: Secondary | ICD-10-CM | POA: Diagnosis not present

## 2019-03-02 DIAGNOSIS — Z794 Long term (current) use of insulin: Secondary | ICD-10-CM | POA: Diagnosis not present

## 2019-03-02 DIAGNOSIS — H2513 Age-related nuclear cataract, bilateral: Secondary | ICD-10-CM | POA: Diagnosis not present

## 2019-03-02 DIAGNOSIS — H43392 Other vitreous opacities, left eye: Secondary | ICD-10-CM | POA: Diagnosis not present

## 2019-03-09 DIAGNOSIS — Z794 Long term (current) use of insulin: Secondary | ICD-10-CM | POA: Diagnosis not present

## 2019-03-09 DIAGNOSIS — D696 Thrombocytopenia, unspecified: Secondary | ICD-10-CM | POA: Diagnosis not present

## 2019-03-09 DIAGNOSIS — E1165 Type 2 diabetes mellitus with hyperglycemia: Secondary | ICD-10-CM | POA: Diagnosis not present

## 2019-03-09 DIAGNOSIS — E782 Mixed hyperlipidemia: Secondary | ICD-10-CM | POA: Diagnosis not present

## 2019-03-16 DIAGNOSIS — E1165 Type 2 diabetes mellitus with hyperglycemia: Secondary | ICD-10-CM | POA: Diagnosis not present

## 2019-03-16 DIAGNOSIS — Z794 Long term (current) use of insulin: Secondary | ICD-10-CM | POA: Diagnosis not present

## 2019-03-16 DIAGNOSIS — Z23 Encounter for immunization: Secondary | ICD-10-CM | POA: Diagnosis not present

## 2019-03-16 DIAGNOSIS — E113213 Type 2 diabetes mellitus with mild nonproliferative diabetic retinopathy with macular edema, bilateral: Secondary | ICD-10-CM | POA: Diagnosis not present

## 2019-03-16 DIAGNOSIS — Z125 Encounter for screening for malignant neoplasm of prostate: Secondary | ICD-10-CM | POA: Diagnosis not present

## 2019-03-16 DIAGNOSIS — E782 Mixed hyperlipidemia: Secondary | ICD-10-CM | POA: Diagnosis not present

## 2019-03-16 DIAGNOSIS — D696 Thrombocytopenia, unspecified: Secondary | ICD-10-CM | POA: Diagnosis not present

## 2019-03-16 DIAGNOSIS — I10 Essential (primary) hypertension: Secondary | ICD-10-CM | POA: Diagnosis not present

## 2019-03-16 DIAGNOSIS — E559 Vitamin D deficiency, unspecified: Secondary | ICD-10-CM | POA: Diagnosis not present

## 2019-06-12 DIAGNOSIS — E1165 Type 2 diabetes mellitus with hyperglycemia: Secondary | ICD-10-CM | POA: Diagnosis not present

## 2019-06-12 DIAGNOSIS — Z794 Long term (current) use of insulin: Secondary | ICD-10-CM | POA: Diagnosis not present

## 2019-06-12 DIAGNOSIS — E782 Mixed hyperlipidemia: Secondary | ICD-10-CM | POA: Diagnosis not present

## 2019-06-15 DIAGNOSIS — E559 Vitamin D deficiency, unspecified: Secondary | ICD-10-CM | POA: Diagnosis not present

## 2019-06-15 DIAGNOSIS — Z794 Long term (current) use of insulin: Secondary | ICD-10-CM | POA: Diagnosis not present

## 2019-06-15 DIAGNOSIS — D696 Thrombocytopenia, unspecified: Secondary | ICD-10-CM | POA: Diagnosis not present

## 2019-06-15 DIAGNOSIS — E113213 Type 2 diabetes mellitus with mild nonproliferative diabetic retinopathy with macular edema, bilateral: Secondary | ICD-10-CM | POA: Diagnosis not present

## 2019-06-15 DIAGNOSIS — E782 Mixed hyperlipidemia: Secondary | ICD-10-CM | POA: Diagnosis not present

## 2019-06-15 DIAGNOSIS — Z125 Encounter for screening for malignant neoplasm of prostate: Secondary | ICD-10-CM | POA: Diagnosis not present

## 2019-06-15 DIAGNOSIS — E1165 Type 2 diabetes mellitus with hyperglycemia: Secondary | ICD-10-CM | POA: Diagnosis not present

## 2019-06-15 DIAGNOSIS — I10 Essential (primary) hypertension: Secondary | ICD-10-CM | POA: Diagnosis not present

## 2019-09-11 DIAGNOSIS — E782 Mixed hyperlipidemia: Secondary | ICD-10-CM | POA: Diagnosis not present

## 2019-09-11 DIAGNOSIS — Z794 Long term (current) use of insulin: Secondary | ICD-10-CM | POA: Diagnosis not present

## 2019-09-11 DIAGNOSIS — E1165 Type 2 diabetes mellitus with hyperglycemia: Secondary | ICD-10-CM | POA: Diagnosis not present

## 2019-09-13 DIAGNOSIS — E559 Vitamin D deficiency, unspecified: Secondary | ICD-10-CM | POA: Diagnosis not present

## 2019-09-13 DIAGNOSIS — E113213 Type 2 diabetes mellitus with mild nonproliferative diabetic retinopathy with macular edema, bilateral: Secondary | ICD-10-CM | POA: Diagnosis not present

## 2019-09-13 DIAGNOSIS — D696 Thrombocytopenia, unspecified: Secondary | ICD-10-CM | POA: Diagnosis not present

## 2019-09-13 DIAGNOSIS — Z794 Long term (current) use of insulin: Secondary | ICD-10-CM | POA: Diagnosis not present

## 2019-09-13 DIAGNOSIS — Z125 Encounter for screening for malignant neoplasm of prostate: Secondary | ICD-10-CM | POA: Diagnosis not present

## 2019-09-13 DIAGNOSIS — E782 Mixed hyperlipidemia: Secondary | ICD-10-CM | POA: Diagnosis not present

## 2019-09-13 DIAGNOSIS — E1165 Type 2 diabetes mellitus with hyperglycemia: Secondary | ICD-10-CM | POA: Diagnosis not present

## 2019-09-13 DIAGNOSIS — I10 Essential (primary) hypertension: Secondary | ICD-10-CM | POA: Diagnosis not present

## 2019-12-21 DIAGNOSIS — Z125 Encounter for screening for malignant neoplasm of prostate: Secondary | ICD-10-CM | POA: Diagnosis not present

## 2019-12-21 DIAGNOSIS — Z794 Long term (current) use of insulin: Secondary | ICD-10-CM | POA: Diagnosis not present

## 2019-12-21 DIAGNOSIS — D696 Thrombocytopenia, unspecified: Secondary | ICD-10-CM | POA: Diagnosis not present

## 2019-12-21 DIAGNOSIS — I10 Essential (primary) hypertension: Secondary | ICD-10-CM | POA: Diagnosis not present

## 2019-12-21 DIAGNOSIS — E113213 Type 2 diabetes mellitus with mild nonproliferative diabetic retinopathy with macular edema, bilateral: Secondary | ICD-10-CM | POA: Diagnosis not present

## 2019-12-21 DIAGNOSIS — E559 Vitamin D deficiency, unspecified: Secondary | ICD-10-CM | POA: Diagnosis not present

## 2019-12-21 DIAGNOSIS — E782 Mixed hyperlipidemia: Secondary | ICD-10-CM | POA: Diagnosis not present

## 2019-12-21 DIAGNOSIS — E1165 Type 2 diabetes mellitus with hyperglycemia: Secondary | ICD-10-CM | POA: Diagnosis not present

## 2020-03-07 DIAGNOSIS — E113293 Type 2 diabetes mellitus with mild nonproliferative diabetic retinopathy without macular edema, bilateral: Secondary | ICD-10-CM | POA: Diagnosis not present

## 2020-03-07 DIAGNOSIS — H3581 Retinal edema: Secondary | ICD-10-CM | POA: Diagnosis not present

## 2020-03-07 DIAGNOSIS — H2513 Age-related nuclear cataract, bilateral: Secondary | ICD-10-CM | POA: Diagnosis not present

## 2020-03-22 DIAGNOSIS — E113319 Type 2 diabetes mellitus with moderate nonproliferative diabetic retinopathy with macular edema, unspecified eye: Secondary | ICD-10-CM | POA: Diagnosis not present

## 2020-03-22 DIAGNOSIS — E1165 Type 2 diabetes mellitus with hyperglycemia: Secondary | ICD-10-CM | POA: Diagnosis not present

## 2020-03-22 DIAGNOSIS — I10 Essential (primary) hypertension: Secondary | ICD-10-CM | POA: Diagnosis not present

## 2020-03-22 DIAGNOSIS — Z125 Encounter for screening for malignant neoplasm of prostate: Secondary | ICD-10-CM | POA: Diagnosis not present

## 2020-03-22 DIAGNOSIS — Z23 Encounter for immunization: Secondary | ICD-10-CM | POA: Diagnosis not present

## 2020-03-22 DIAGNOSIS — Z794 Long term (current) use of insulin: Secondary | ICD-10-CM | POA: Diagnosis not present

## 2020-03-22 DIAGNOSIS — D696 Thrombocytopenia, unspecified: Secondary | ICD-10-CM | POA: Diagnosis not present

## 2020-03-22 DIAGNOSIS — E782 Mixed hyperlipidemia: Secondary | ICD-10-CM | POA: Diagnosis not present

## 2020-03-22 DIAGNOSIS — E559 Vitamin D deficiency, unspecified: Secondary | ICD-10-CM | POA: Diagnosis not present

## 2020-08-05 ENCOUNTER — Ambulatory Visit: Payer: Self-pay | Admitting: Surgical

## 2020-08-05 ENCOUNTER — Other Ambulatory Visit: Payer: Self-pay

## 2020-08-05 ENCOUNTER — Ambulatory Visit: Payer: HMO | Admitting: Surgical

## 2020-08-05 ENCOUNTER — Other Ambulatory Visit: Payer: Self-pay | Admitting: Surgical

## 2020-08-05 DIAGNOSIS — R0781 Pleurodynia: Secondary | ICD-10-CM

## 2020-08-05 MED ORDER — CELECOXIB 100 MG PO CAPS: 100.0000 mg | ORAL_CAPSULE | Freq: Two times a day (BID) | ORAL | 0 refills | Status: DC

## 2020-08-05 NOTE — Progress Notes (Signed)
Office Visit Note   Patient: Jose Miles           Date of Birth: 07/30/1950           MRN: [id number] Visit Date: 08/05/2020 Requested by: Altheimer, Casimiro Needle, MD [address] [place],  [place] [address] PCP: Hali Marry, MD  Subjective: Chief Complaint  Patient presents with  . Other    Posterior rib pain-fall 07/30/20    HPI: Jose Miles is a 70 y.o. male who presents to the office complaining of right posterior rib pain.  Patient had a fall on 07/30/2020.  He notes pain since the fall.  He does note pain that is worse with breathing but no shortness of breath or trouble catching his breath.  Denies any bruising or swelling that he has noted.  He saw his chiropractor who ordered x-rays.  Denies any shoulder pain or difficulty moving his shoulder.  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  He does note sharp pain with coughing..                ROS: All systems reviewed are negative as they relate to the chief complaint within the history of present illness.  Patient denies fevers or chills.  Assessment & Plan: Visit Diagnoses: No diagnosis found.  Plan: Patient is a 70 year old male who presents following fall on 07/30/2020.  Complains of posterior rib pain on the right side.  He does note some increased pain with deep breaths but no shortness of breath or difficulty catching his breath.  No bruising or discoloration noted on exam today.  Radiographs from his chiropractor were reviewed and no rib fractures or pneumothorax noted.  Pain should steadily improve over coming weeks. Prescribed celebrex for symptomatic relief.  Pt declined history of MI, stroke, CKD.  4 week return  Follow-Up Instructions: No follow-ups on file.   Orders:  No orders of the defined types were placed in this encounter.  Meds ordered this encounter  Medications  . DISCONTD: celecoxib (CELEBREX) 100 MG capsule    Sig: Take 1 capsule (100 mg total) by mouth 2 (two) times  daily.    Dispense:  60 capsule    Refill:  0      Procedures: No procedures performed   Clinical Data: No additional findings.  Objective: Vital Signs: There were no vitals taken for this visit.  Physical Exam:  Constitutional: Patient appears well-developed HEENT:  Head: Normocephalic Eyes:EOM are normal Neck: Normal range of motion Cardiovascular: Normal rate Pulmonary/chest: Effort normal Neurologic: Patient is alert Skin: Skin is warm Psychiatric: Patient has normal mood and affect  Ortho Exam: Ortho exam demonstrates no swelling or bruising throughout the right back or flank.  Point tenderness over the shaft of one rib posteriorly.  No pain with passive shoulder range of motion.  Active shoulder range of motion is equivalent to passive motion of the right shoulder.  Excellent rotator cuff strength of the right shoulder.  No crepitus noted over the area of tenderness or any redness in the shoulder.  Specialty Comments:  No specialty comments available.  Imaging: No results found.   PMFS History: There are no problems to display for this patient.  No past medical history on file.  No family history on file.  No past surgical history on file. Social History   Occupational History  . Not on file  Tobacco Use  . Smoking status: Former Games developer  . Smokeless tobacco:  Not on file  Substance and Sexual Activity  . Alcohol use: Not on file  . Drug use: Not on file  . Sexual activity: Not on file

## 2020-08-05 NOTE — Telephone Encounter (Signed)
Pls advise.  

## 2020-08-15 ENCOUNTER — Encounter: Payer: Self-pay | Admitting: Surgical

## 2020-09-04 ENCOUNTER — Ambulatory Visit: Payer: HMO | Admitting: Orthopedic Surgery

## 2022-06-30 DIAGNOSIS — H109 Unspecified conjunctivitis: Secondary | ICD-10-CM | POA: Diagnosis not present

## 2022-06-30 DIAGNOSIS — B9689 Other specified bacterial agents as the cause of diseases classified elsewhere: Secondary | ICD-10-CM | POA: Diagnosis not present

## 2022-08-04 DIAGNOSIS — Z1211 Encounter for screening for malignant neoplasm of colon: Secondary | ICD-10-CM | POA: Diagnosis not present

## 2022-10-20 DIAGNOSIS — Z794 Long term (current) use of insulin: Secondary | ICD-10-CM | POA: Diagnosis not present

## 2022-10-20 DIAGNOSIS — E113293 Type 2 diabetes mellitus with mild nonproliferative diabetic retinopathy without macular edema, bilateral: Secondary | ICD-10-CM | POA: Diagnosis not present

## 2022-10-20 DIAGNOSIS — E1122 Type 2 diabetes mellitus with diabetic chronic kidney disease: Secondary | ICD-10-CM | POA: Diagnosis not present

## 2022-10-20 DIAGNOSIS — I129 Hypertensive chronic kidney disease with stage 1 through stage 4 chronic kidney disease, or unspecified chronic kidney disease: Secondary | ICD-10-CM | POA: Diagnosis not present

## 2022-10-20 DIAGNOSIS — N181 Chronic kidney disease, stage 1: Secondary | ICD-10-CM | POA: Diagnosis not present

## 2022-10-20 DIAGNOSIS — E1169 Type 2 diabetes mellitus with other specified complication: Secondary | ICD-10-CM | POA: Diagnosis not present

## 2022-10-20 DIAGNOSIS — Z978 Presence of other specified devices: Secondary | ICD-10-CM | POA: Diagnosis not present

## 2022-10-20 DIAGNOSIS — E538 Deficiency of other specified B group vitamins: Secondary | ICD-10-CM | POA: Diagnosis not present

## 2022-10-20 DIAGNOSIS — E785 Hyperlipidemia, unspecified: Secondary | ICD-10-CM | POA: Diagnosis not present

## 2022-10-20 DIAGNOSIS — E11649 Type 2 diabetes mellitus with hypoglycemia without coma: Secondary | ICD-10-CM | POA: Diagnosis not present

## 2022-10-20 DIAGNOSIS — E1165 Type 2 diabetes mellitus with hyperglycemia: Secondary | ICD-10-CM | POA: Diagnosis not present

## 2022-10-20 DIAGNOSIS — E559 Vitamin D deficiency, unspecified: Secondary | ICD-10-CM | POA: Diagnosis not present

## 2022-10-29 DIAGNOSIS — E1165 Type 2 diabetes mellitus with hyperglycemia: Secondary | ICD-10-CM | POA: Diagnosis not present

## 2022-12-28 DIAGNOSIS — H25811 Combined forms of age-related cataract, right eye: Secondary | ICD-10-CM | POA: Diagnosis not present

## 2022-12-28 DIAGNOSIS — H25812 Combined forms of age-related cataract, left eye: Secondary | ICD-10-CM | POA: Diagnosis not present

## 2022-12-28 DIAGNOSIS — E113391 Type 2 diabetes mellitus with moderate nonproliferative diabetic retinopathy without macular edema, right eye: Secondary | ICD-10-CM | POA: Diagnosis not present

## 2023-02-22 DIAGNOSIS — E1169 Type 2 diabetes mellitus with other specified complication: Secondary | ICD-10-CM | POA: Diagnosis not present

## 2023-02-22 DIAGNOSIS — E785 Hyperlipidemia, unspecified: Secondary | ICD-10-CM | POA: Diagnosis not present

## 2023-02-22 DIAGNOSIS — I129 Hypertensive chronic kidney disease with stage 1 through stage 4 chronic kidney disease, or unspecified chronic kidney disease: Secondary | ICD-10-CM | POA: Diagnosis not present

## 2023-02-22 DIAGNOSIS — N181 Chronic kidney disease, stage 1: Secondary | ICD-10-CM | POA: Diagnosis not present

## 2023-02-22 DIAGNOSIS — Z794 Long term (current) use of insulin: Secondary | ICD-10-CM | POA: Diagnosis not present

## 2023-02-22 DIAGNOSIS — E1122 Type 2 diabetes mellitus with diabetic chronic kidney disease: Secondary | ICD-10-CM | POA: Diagnosis not present

## 2023-02-22 DIAGNOSIS — E1165 Type 2 diabetes mellitus with hyperglycemia: Secondary | ICD-10-CM | POA: Diagnosis not present

## 2023-02-22 DIAGNOSIS — E113293 Type 2 diabetes mellitus with mild nonproliferative diabetic retinopathy without macular edema, bilateral: Secondary | ICD-10-CM | POA: Diagnosis not present

## 2023-02-22 DIAGNOSIS — E11649 Type 2 diabetes mellitus with hypoglycemia without coma: Secondary | ICD-10-CM | POA: Diagnosis not present

## 2023-02-23 DIAGNOSIS — Z978 Presence of other specified devices: Secondary | ICD-10-CM | POA: Diagnosis not present

## 2023-02-23 DIAGNOSIS — E785 Hyperlipidemia, unspecified: Secondary | ICD-10-CM | POA: Diagnosis not present

## 2023-02-23 DIAGNOSIS — E1165 Type 2 diabetes mellitus with hyperglycemia: Secondary | ICD-10-CM | POA: Diagnosis not present

## 2023-02-23 DIAGNOSIS — E11649 Type 2 diabetes mellitus with hypoglycemia without coma: Secondary | ICD-10-CM | POA: Diagnosis not present

## 2023-02-23 DIAGNOSIS — E559 Vitamin D deficiency, unspecified: Secondary | ICD-10-CM | POA: Diagnosis not present

## 2023-02-23 DIAGNOSIS — E1122 Type 2 diabetes mellitus with diabetic chronic kidney disease: Secondary | ICD-10-CM | POA: Diagnosis not present

## 2023-02-23 DIAGNOSIS — N181 Chronic kidney disease, stage 1: Secondary | ICD-10-CM | POA: Diagnosis not present

## 2023-02-23 DIAGNOSIS — E1169 Type 2 diabetes mellitus with other specified complication: Secondary | ICD-10-CM | POA: Diagnosis not present

## 2023-02-23 DIAGNOSIS — E113293 Type 2 diabetes mellitus with mild nonproliferative diabetic retinopathy without macular edema, bilateral: Secondary | ICD-10-CM | POA: Diagnosis not present

## 2023-02-23 DIAGNOSIS — E538 Deficiency of other specified B group vitamins: Secondary | ICD-10-CM | POA: Diagnosis not present

## 2023-02-23 DIAGNOSIS — Z794 Long term (current) use of insulin: Secondary | ICD-10-CM | POA: Diagnosis not present

## 2023-02-23 DIAGNOSIS — I129 Hypertensive chronic kidney disease with stage 1 through stage 4 chronic kidney disease, or unspecified chronic kidney disease: Secondary | ICD-10-CM | POA: Diagnosis not present

## 2023-06-08 DIAGNOSIS — S46011A Strain of muscle(s) and tendon(s) of the rotator cuff of right shoulder, initial encounter: Secondary | ICD-10-CM | POA: Diagnosis not present

## 2023-06-08 DIAGNOSIS — M25511 Pain in right shoulder: Secondary | ICD-10-CM | POA: Diagnosis not present

## 2023-07-07 DIAGNOSIS — E113391 Type 2 diabetes mellitus with moderate nonproliferative diabetic retinopathy without macular edema, right eye: Secondary | ICD-10-CM | POA: Diagnosis not present

## 2023-07-07 DIAGNOSIS — H43811 Vitreous degeneration, right eye: Secondary | ICD-10-CM | POA: Diagnosis not present

## 2023-07-07 DIAGNOSIS — H25812 Combined forms of age-related cataract, left eye: Secondary | ICD-10-CM | POA: Diagnosis not present

## 2023-07-27 DIAGNOSIS — H2512 Age-related nuclear cataract, left eye: Secondary | ICD-10-CM | POA: Diagnosis not present

## 2023-08-02 DIAGNOSIS — E1122 Type 2 diabetes mellitus with diabetic chronic kidney disease: Secondary | ICD-10-CM | POA: Diagnosis not present

## 2023-08-02 DIAGNOSIS — E113293 Type 2 diabetes mellitus with mild nonproliferative diabetic retinopathy without macular edema, bilateral: Secondary | ICD-10-CM | POA: Diagnosis not present

## 2023-08-02 DIAGNOSIS — E11649 Type 2 diabetes mellitus with hypoglycemia without coma: Secondary | ICD-10-CM | POA: Diagnosis not present

## 2023-08-02 DIAGNOSIS — N181 Chronic kidney disease, stage 1: Secondary | ICD-10-CM | POA: Diagnosis not present

## 2023-08-02 DIAGNOSIS — Z794 Long term (current) use of insulin: Secondary | ICD-10-CM | POA: Diagnosis not present

## 2023-08-02 DIAGNOSIS — I129 Hypertensive chronic kidney disease with stage 1 through stage 4 chronic kidney disease, or unspecified chronic kidney disease: Secondary | ICD-10-CM | POA: Diagnosis not present

## 2023-08-02 DIAGNOSIS — E785 Hyperlipidemia, unspecified: Secondary | ICD-10-CM | POA: Diagnosis not present

## 2023-08-02 DIAGNOSIS — E1165 Type 2 diabetes mellitus with hyperglycemia: Secondary | ICD-10-CM | POA: Diagnosis not present

## 2023-08-02 DIAGNOSIS — E1169 Type 2 diabetes mellitus with other specified complication: Secondary | ICD-10-CM | POA: Diagnosis not present

## 2023-08-23 DIAGNOSIS — Z0000 Encounter for general adult medical examination without abnormal findings: Secondary | ICD-10-CM | POA: Diagnosis not present

## 2023-08-23 DIAGNOSIS — Z136 Encounter for screening for cardiovascular disorders: Secondary | ICD-10-CM | POA: Diagnosis not present

## 2023-08-23 DIAGNOSIS — E1165 Type 2 diabetes mellitus with hyperglycemia: Secondary | ICD-10-CM | POA: Diagnosis not present

## 2023-08-23 DIAGNOSIS — E1169 Type 2 diabetes mellitus with other specified complication: Secondary | ICD-10-CM | POA: Diagnosis not present

## 2023-08-23 DIAGNOSIS — I10 Essential (primary) hypertension: Secondary | ICD-10-CM | POA: Diagnosis not present

## 2023-08-23 DIAGNOSIS — E785 Hyperlipidemia, unspecified: Secondary | ICD-10-CM | POA: Diagnosis not present

## 2023-08-23 DIAGNOSIS — R350 Frequency of micturition: Secondary | ICD-10-CM | POA: Diagnosis not present

## 2023-08-23 DIAGNOSIS — Z794 Long term (current) use of insulin: Secondary | ICD-10-CM | POA: Diagnosis not present

## 2023-08-31 DIAGNOSIS — Z136 Encounter for screening for cardiovascular disorders: Secondary | ICD-10-CM | POA: Diagnosis not present

## 2023-08-31 DIAGNOSIS — Z87891 Personal history of nicotine dependence: Secondary | ICD-10-CM | POA: Diagnosis not present

## 2023-09-02 DIAGNOSIS — H2511 Age-related nuclear cataract, right eye: Secondary | ICD-10-CM | POA: Diagnosis not present

## 2023-09-07 DIAGNOSIS — H2511 Age-related nuclear cataract, right eye: Secondary | ICD-10-CM | POA: Diagnosis not present

## 2023-10-05 DIAGNOSIS — Z0100 Encounter for examination of eyes and vision without abnormal findings: Secondary | ICD-10-CM | POA: Diagnosis not present

## 2023-10-05 DIAGNOSIS — H524 Presbyopia: Secondary | ICD-10-CM | POA: Diagnosis not present

## 2023-10-26 DIAGNOSIS — K1329 Other disturbances of oral epithelium, including tongue: Secondary | ICD-10-CM | POA: Diagnosis not present

## 2023-10-26 DIAGNOSIS — K1239 Other oral mucositis (ulcerative): Secondary | ICD-10-CM | POA: Diagnosis not present

## 2023-11-12 DIAGNOSIS — Z794 Long term (current) use of insulin: Secondary | ICD-10-CM | POA: Diagnosis not present

## 2023-11-12 DIAGNOSIS — Z13220 Encounter for screening for lipoid disorders: Secondary | ICD-10-CM | POA: Diagnosis not present

## 2023-11-12 DIAGNOSIS — E1165 Type 2 diabetes mellitus with hyperglycemia: Secondary | ICD-10-CM | POA: Diagnosis not present

## 2023-11-15 DIAGNOSIS — Z794 Long term (current) use of insulin: Secondary | ICD-10-CM | POA: Diagnosis not present

## 2023-11-15 DIAGNOSIS — E1165 Type 2 diabetes mellitus with hyperglycemia: Secondary | ICD-10-CM | POA: Diagnosis not present

## 2024-01-27 DIAGNOSIS — Z008 Encounter for other general examination: Secondary | ICD-10-CM | POA: Diagnosis not present

## 2024-03-27 NOTE — Progress Notes (Signed)
 Filippo Puls                                          MRN: [id number]   03/27/2024   The [facility] Quality Team Specialist reviewed this patient medical record for the purposes of chart review for care gap closure. The following were reviewed: chart review for care gap closure-kidney health evaluation for diabetes:eGFR  and uACR.    [facility] Quality Team

## 2024-04-04 DIAGNOSIS — H43811 Vitreous degeneration, right eye: Secondary | ICD-10-CM | POA: Diagnosis not present

## 2024-04-04 DIAGNOSIS — E113291 Type 2 diabetes mellitus with mild nonproliferative diabetic retinopathy without macular edema, right eye: Secondary | ICD-10-CM | POA: Diagnosis not present

## 2024-04-04 DIAGNOSIS — Z961 Presence of intraocular lens: Secondary | ICD-10-CM | POA: Diagnosis not present

## 2024-05-04 DIAGNOSIS — R1031 Right lower quadrant pain: Secondary | ICD-10-CM | POA: Diagnosis not present

## 2024-05-04 DIAGNOSIS — M1611 Unilateral primary osteoarthritis, right hip: Secondary | ICD-10-CM | POA: Diagnosis not present

## 2024-05-10 DIAGNOSIS — M1611 Unilateral primary osteoarthritis, right hip: Secondary | ICD-10-CM | POA: Diagnosis not present

## 2024-06-10 ENCOUNTER — Ambulatory Visit
Admission: EM | Admit: 2024-06-10 | Discharge: 2024-06-10 | Disposition: A | Discharge status: Home / Self Care | Attending: Family Medicine | Admitting: Family Medicine

## 2024-06-10 ENCOUNTER — Other Ambulatory Visit: Payer: Self-pay

## 2024-06-10 DIAGNOSIS — R112 Nausea with vomiting, unspecified: Secondary | ICD-10-CM

## 2024-06-10 DIAGNOSIS — A084 Viral intestinal infection, unspecified: Secondary | ICD-10-CM

## 2024-06-10 LAB — POC SOFIA SARS ANTIGEN FIA: SARS Coronavirus 2 Ag: NEGATIVE

## 2024-06-10 LAB — POCT INFLUENZA A/B
Influenza A, POC: NEGATIVE
Influenza B, POC: NEGATIVE

## 2024-06-10 MED ORDER — ONDANSETRON 4 MG PO TBDP
4.0000 mg | ORAL_TABLET | Freq: Once | ORAL | Status: AC
  Administered 2024-06-10: 4 mg via ORAL

## 2024-06-10 MED ORDER — ONDANSETRON 4 MG PO TBDP: 4.0000 mg | ORAL_TABLET | Freq: Three times a day (TID) | ORAL | 0 refills | Status: AC | PRN

## 2024-06-10 NOTE — ED Triage Notes (Signed)
 Pt c/o N/Vx3d. Pt states only able to keep water down

## 2024-06-10 NOTE — ED Provider Notes (Signed)
 [facility]    CSN: [id number] Arrival date & time: 06/10/24  0909      History   Chief Complaint Chief Complaint  Patient presents with   Emesis    HPI Jose Miles is a 73 y.o. male  presents for evaluation of URI symptoms for 3 days. Patient reports associated symptoms of nausea with nonbilious nonbloody vomiting and diarrhea. Denies fevers, URI symptoms/cough, congestion, sore throat, body aches, dysuria, dizziness, syncope, headache, abdominal pain.  Patient does have diabetes but states his sugars have been running in the 90s.  He is able to keep water down but states he cannot keep any food down.  No known sick contacts.  No recent travel.  No history of IBS, Crohn's, colitis or diverticulitis.  Pt has taken nausea medication OTC for symptoms. Pt has no other concerns at this time.    Emesis Associated symptoms: diarrhea     Past Medical History:  Diagnosis Date   Diabetes mellitus without complication (HCC)     There are no active problems to display for this patient.   Past Surgical History:  Procedure Laterality Date   ROTATOR CUFF REPAIR Bilateral        Home Medications    Prior to Admission medications  Medication Sig Start Date End Date Taking? Authorizing Provider  aspirin EC 81 MG tablet Take 81 mg by mouth daily. 10/29/11  Yes [provider]  cetirizine-pseudoephedrine (ZYRTEC-D) 5-120 MG tablet Take 1 tablet by mouth 2 (two) times daily.   Yes [provider]  CINNAMON PO Take by mouth.   Yes [provider]  Cyanocobalamin 1000 MCG TBCR Take 1,000 mcg by mouth daily. 06/30/21  Yes [provider]  hydrochlorothiazide (HYDRODIURIL) 12.5 MG tablet Take 12.5 mg by mouth daily.   Yes [provider]  naproxen (EC NAPROSYN) 500 MG EC tablet Take 500 mg by mouth 2 (two) times daily with a meal.   Yes [provider]  Omega-3 Fatty Acids (FISH OIL) 1000 MG CAPS Take 1,000 mg by mouth daily.  06/30/21  Yes [provider]  ondansetron  (ZOFRAN -ODT) 4 MG disintegrating tablet Take 1 tablet (4 mg total) by mouth every 8 (eight) hours as needed for nausea or vomiting. 06/10/24  Yes Attallah Ontko, Jodi R, NP  Turmeric (QC TUMERIC COMPLEX PO) Take by mouth.   Yes [provider]  atorvastatin (LIPITOR) 40 MG tablet  09/19/15   [provider]  BD PEN NEEDLE NANO U/F 32G X 4 MM MISC  09/19/15   [provider]  celecoxib  (CELEBREX ) 100 MG capsule TAKE 1 CAPSULE(100 MG) BY MOUTH TWICE DAILY 08/05/20   Magnant, Charles L, PA-C  GLIPIZIDE XL 10 MG 24 hr tablet  10/19/15   [provider]  hydrochlorothiazide (HYDRODIURIL) 25 MG tablet  09/29/15   [provider]  INVOKANA 300 MG TABS tablet  10/19/15   [provider]  JANUVIA 100 MG tablet  09/19/15   [provider]  JARDIANCE 25 MG TABS tablet Take 25 mg by mouth daily.    [provider]  LANTUS SOLOSTAR 100 UNIT/ML Solostar Pen  10/25/15   [provider]  metFORMIN (GLUCOPHAGE-XR) 500 MG 24 hr tablet Take 1,000 mg by mouth 2 (two) times daily.    [provider]  [facility] VERIO test strip  10/10/15   [provider]  pioglitazone (ACTOS) 45 MG tablet  10/19/15   [provider]  valsartan (DIOVAN) 160 MG tablet  10/16/15   [provider]  Vitamin D, Ergocalciferol, (DRISDOL) 50000 units CAPS capsule  08/16/15   [provider]  zolpidem (AMBIEN) 5 MG tablet  10/25/15   [provider]    Family History History reviewed. No pertinent family history.  Social History Social History[1]   Allergies   Patient has no known allergies.   Review of Systems Review of Systems  Gastrointestinal:  Positive for diarrhea, nausea and vomiting.     Physical Exam Triage Vital Signs ED Triage Vitals  Encounter Vitals Group     BP 06/10/24 1007 114/68     Girls Systolic BP Percentile --      Girls Diastolic BP  Percentile --      Boys Systolic BP Percentile --      Boys Diastolic BP Percentile --      Pulse Rate 06/10/24 1007 87     Resp 06/10/24 1007 18     Temp 06/10/24 1007 98.9 F (37.2 C)     Temp Source 06/10/24 1007 Oral     SpO2 06/10/24 1007 97 %     Weight --      Height --      Head Circumference --      Peak Flow --      Pain Score 06/10/24 0955 0     Pain Loc --      Pain Education --      Exclude from Growth Chart --    No data found.  Updated Vital Signs BP 114/68   Pulse 87   Temp 98.9 F (37.2 C) (Oral)   Resp 18   SpO2 97%   Visual Acuity Right Eye Distance:   Left Eye Distance:   Bilateral Distance:    Right Eye Near:   Left Eye Near:    Bilateral Near:     Physical Exam Vitals and nursing note reviewed.  Constitutional:      General: He is not in acute distress.    Appearance: Normal appearance. He is not ill-appearing.  HENT:     Head: Normocephalic and atraumatic.  Eyes:     Pupils: Pupils are equal, round, and reactive to light.  Cardiovascular:     Rate and Rhythm: Normal rate.  Pulmonary:     Effort: Pulmonary effort is normal.  Abdominal:     General: Bowel sounds are normal. There is no distension.     Palpations: Abdomen is soft.     Tenderness: There is no abdominal tenderness. There is no guarding or rebound.  Skin:    General: Skin is warm and dry.  Neurological:     General: No focal deficit present.     Mental Status: He is alert and oriented to person, place, and time.  Psychiatric:        Mood and Affect: Mood normal.        Behavior: Behavior normal.      UC Treatments / Results  Labs (all labs ordered are listed, but only abnormal results are displayed) Labs Reviewed  POCT INFLUENZA A/B  POC SOFIA SARS ANTIGEN FIA    EKG   Radiology No results found.  Procedures Procedures (including critical care time)  Medications Ordered in UC Medications  ondansetron  (ZOFRAN -ODT) disintegrating tablet 4 mg (4 mg  Oral Given 06/10/24 1028)    Initial Impression / Assessment and Plan / UC Course  I have reviewed the triage vital signs and the nursing notes.  Pertinent labs & imaging  results that were available during my care of the patient were reviewed by me and considered in my medical decision making (see chart for details).     Reviewed exam and symptoms with patient.  No red flags.  Negative COVID and flu testing.  Discussed viral gastroenteritis and symptomatic treatment.  Rx Zofran  sent to pharmacy and patient given dose in clinic.  Discussed fluid/electrolyte replacement.  PCP follow-up 2 to 3 days for recheck.  ER precautions reviewed Final Clinical Impressions(s) / UC Diagnoses   Final diagnoses:  Nausea and vomiting, unspecified vomiting type  Viral gastroenteritis     Discharge Instructions      You tested negative for COVID and flu.  You were given Zofran  in the clinic for nausea and vomiting.  You may take this every 8 hours as needed.  Focus on hydration/electrolyte replacement with Gatorade, Powerade, Pedialyte, water.  Bland diet and advance as you tolerate.  Lots of rest.  Please follow-up with your PCP in 2 to 3 days for recheck.  Please go to emergency room for any worsening symptoms.  Hope you feel better soon!     ED Prescriptions     Medication Sig Dispense Auth. Provider   ondansetron  (ZOFRAN -ODT) 4 MG disintegrating tablet Take 1 tablet (4 mg total) by mouth every 8 (eight) hours as needed for nausea or vomiting. 8 tablet Kana Reimann, Jodi R, NP      PDMP not reviewed this encounter.     [1]  Social History Tobacco Use   Smoking status: Former    Types: Cigarettes  Vaping Use   Vaping status: Never Used  Substance Use Topics   Alcohol use: Yes    Comment: 2 bottles of of liquor/week   Drug use: Never     Loreda Myla SAUNDERS, NP 06/10/24 1055

## 2024-06-10 NOTE — Discharge Instructions (Addendum)
 You tested negative for COVID and flu.  You were given Zofran  in the clinic for nausea and vomiting.  You may take this every 8 hours as needed.  Focus on hydration/electrolyte replacement with Gatorade, Powerade, Pedialyte, water.  Bland diet and advance as you tolerate.  Lots of rest.  Please follow-up with your PCP in 2 to 3 days for recheck.  Please go to emergency room for any worsening symptoms.  Hope you feel better soon!
